# Patient Record
Sex: Female | Born: 1975 | Race: Black or African American | Hispanic: No | Marital: Married | Smoking: Never smoker
Health system: Southern US, Community
[De-identification: ages and names within clinical notes are randomized; demographics above are authoritative.]

---

## 2006-12-28 ENCOUNTER — Encounter (INDEPENDENT_AMBULATORY_CARE_PROVIDER_SITE_OTHER): Payer: Self-pay | Admitting: Obstetrics and Gynecology

## 2006-12-28 ENCOUNTER — Ambulatory Visit (HOSPITAL_COMMUNITY): Admission: AD | Admit: 2006-12-28 | Discharge: 2006-12-29 | Payer: Self-pay | Admitting: Obstetrics and Gynecology

## 2006-12-28 ENCOUNTER — Emergency Department (HOSPITAL_COMMUNITY): Admission: EM | Admit: 2006-12-28 | Discharge: 2006-12-28 | Payer: Self-pay | Admitting: Emergency Medicine

## 2007-01-12 ENCOUNTER — Ambulatory Visit: Payer: Self-pay | Admitting: Gynecology

## 2007-02-23 ENCOUNTER — Ambulatory Visit (HOSPITAL_COMMUNITY): Admission: RE | Admit: 2007-02-23 | Discharge: 2007-02-23 | Payer: Self-pay | Admitting: Obstetrics and Gynecology

## 2008-03-22 ENCOUNTER — Emergency Department: Payer: Self-pay | Admitting: Emergency Medicine

## 2008-10-28 ENCOUNTER — Emergency Department: Payer: Self-pay | Admitting: Emergency Medicine

## 2008-12-30 ENCOUNTER — Observation Stay: Payer: Self-pay | Admitting: Obstetrics & Gynecology

## 2009-04-29 ENCOUNTER — Ambulatory Visit: Payer: Self-pay | Admitting: Family Medicine

## 2009-12-13 IMAGING — US US OB < 14 WEEKS - US OB TV
1 series · 17 of 28 positions shown · non-contrast
Comparison: none

REASON FOR EXAM: spotting; unclear dates; hcg 3832
COMMENTS:

[Series 1: us ob < 14 weeks - us ob tv · 17 of 63 slices shown]
[im 1/63]
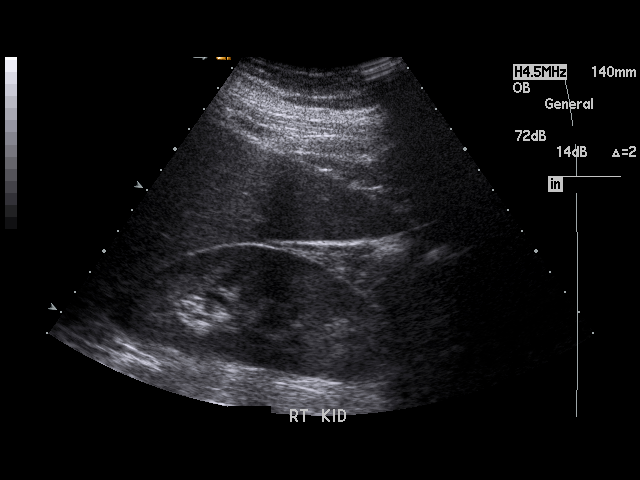
[im 5/63]
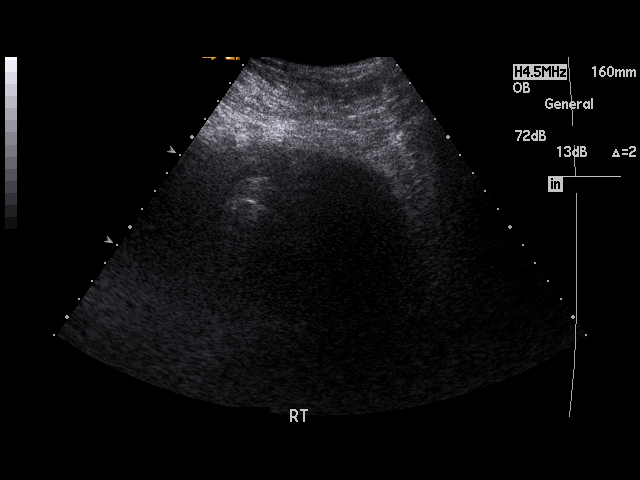
[im 10/63]
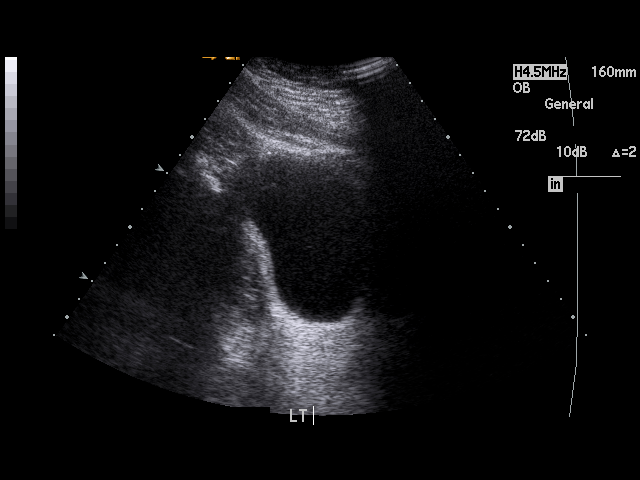
[im 12/63]
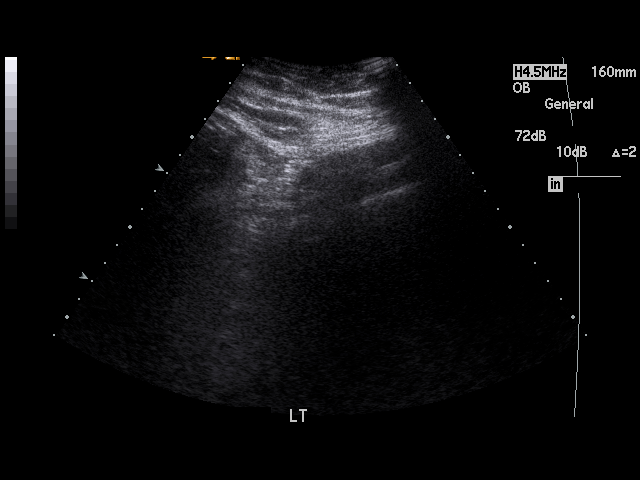
[im 17/63]
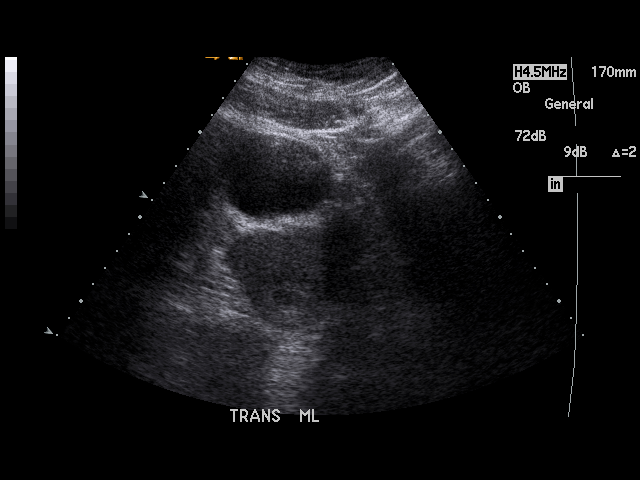
[im 21/63]
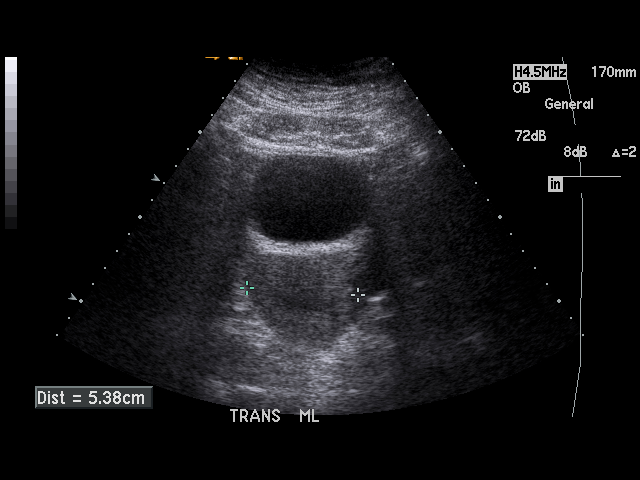
[im 23/63]
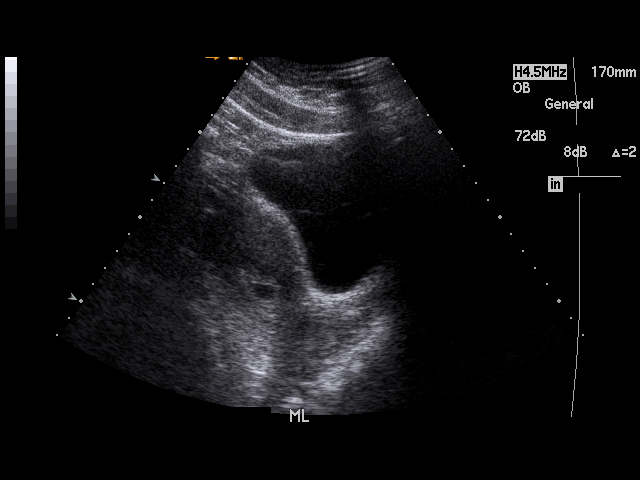
[im 28/63]
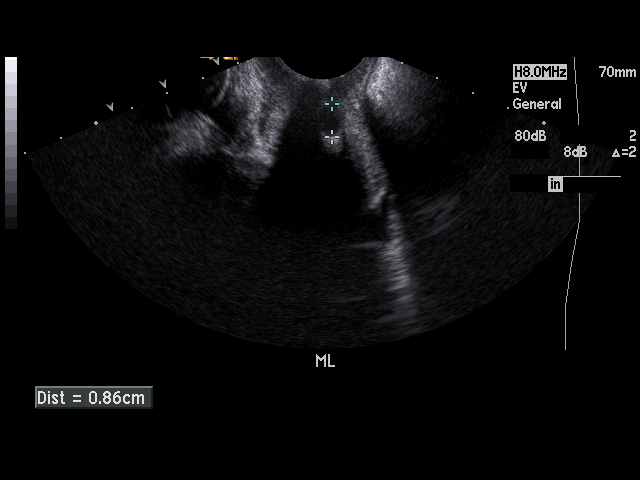
[im 33/63]
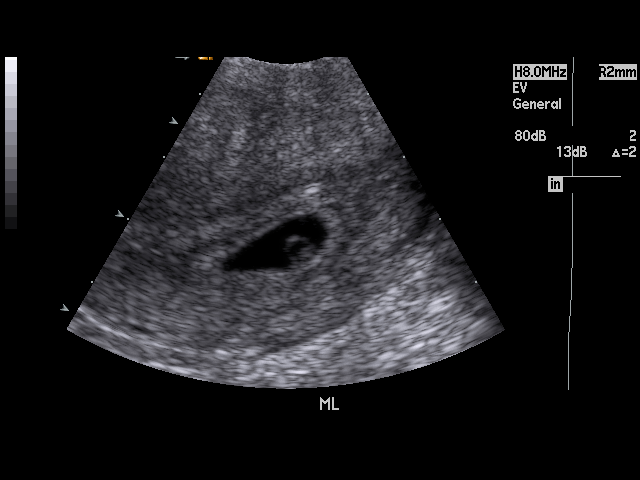
[im 35/63]
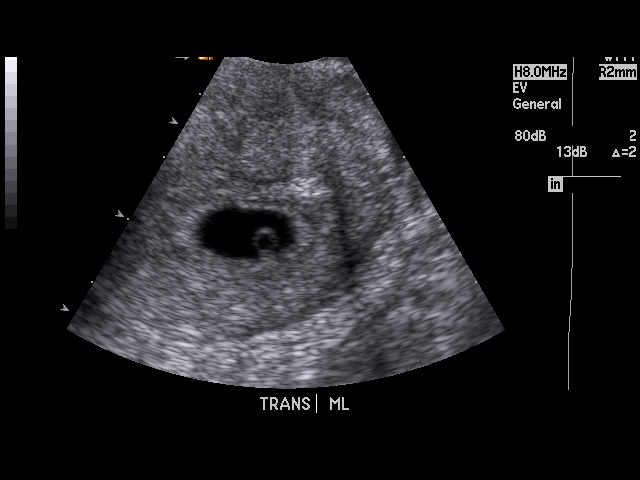
[im 40/63]
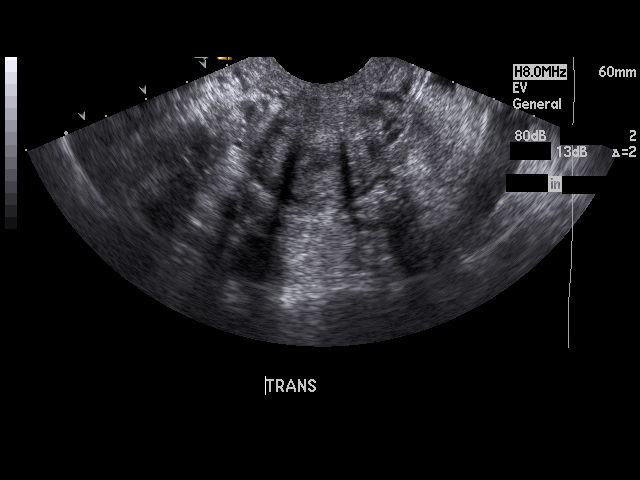
[im 42/63]
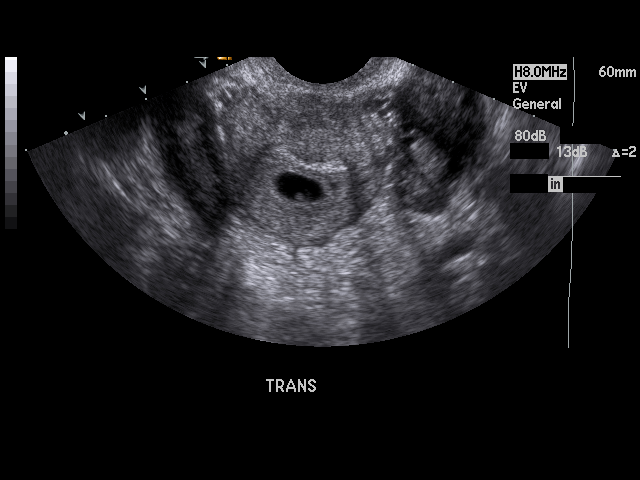
[im 46/63]
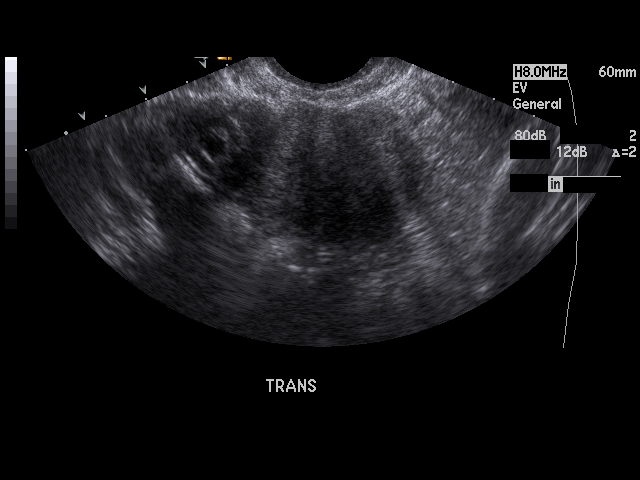
[im 51/63]
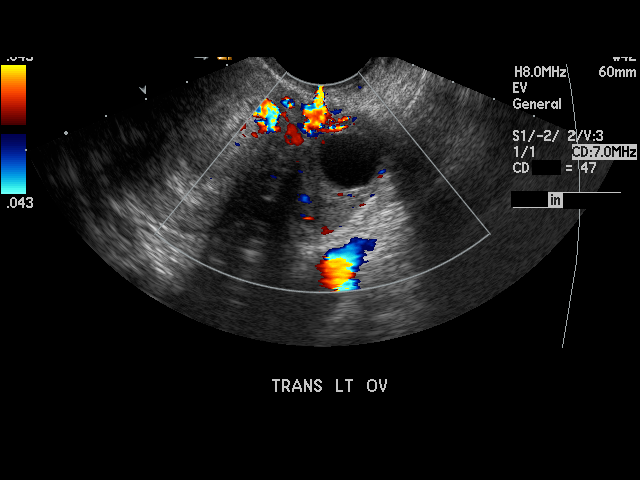
[im 53/63]
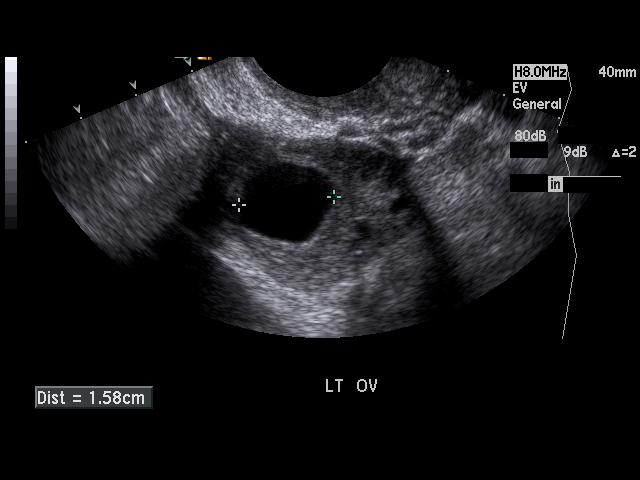
[im 58/63]
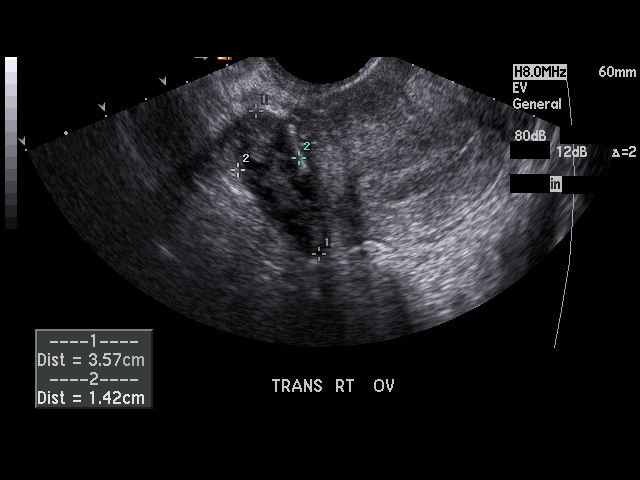
[im 63/63]
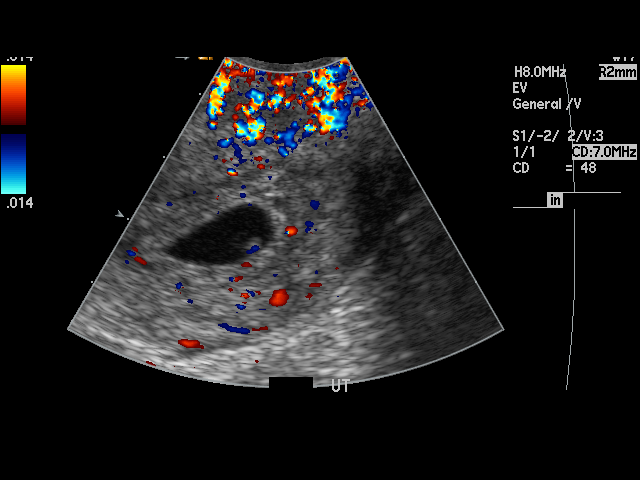

[17 of 28 positions shown; findings below may reference images not displayed]

PROCEDURE:     US  - US OB LESS THAN 14 WEEKS/W TRANS  - October 28, 2008  [DATE]

RESULT:     There is a viable IUP present. The crown-rump length measures
0.23 cm corresponding to a 5 week 5 day gestation. A yolk sac is
demonstrated. The cardiac rate is somewhat low at 95 beats per minute. There
is a tiny amount of free fluid in the cul-de-sac. The right ovary measures
3.4 x 3.6 x 1.4 cm. The left ovary measures 3.3 x 3.8 x 1.8 cm. There is an
ovarian cyst on the left measuring 1.6 cm in diameter. The patient has
history of prior surgery for a septate uterus. No duplication of the uterine
cavities is identified currently.
IMPRESSION: 1. There is an early IUP with an estimated gestational age of 5 weeks 5
days. The cardiac rate is somewhat lobe. The estimated date of confinement
is 25 June, 2009. Followup scanning is available as felt clinically
indicated.
2. There is a 1.6 cm diameter left ovarian cyst.

A preliminary report was called to the [HOSPITAL] the
conclusion of the study

## 2010-10-30 NOTE — Op Note (Signed)
NAMEANGELICE, PIECH           ACCOUNT NO.:  [id number]   MEDICAL RECORD NO.:  [id number]          PATIENT TYPE:  MAT   LOCATION:  [facility]                          FACILITY:  [facility]   PHYSICIAN:  Malachi Pro. Ambrose Mantle, M.D. DATE OF BIRTH:  08/24/1975   DATE OF PROCEDURE:  12/28/2006  DATE OF DISCHARGE:                               OPERATIVE REPORT   PREOPERATIVE DIAGNOSIS:  Incomplete abortion.   POSTOPERATIVE DIAGNOSIS:  Incomplete abortion.   OPERATION:  Suction D&C.   OPERATOR:  Dr. Ambrose Mantle   ANESTHESIA:  General anesthesia with mask.   DESCRIPTION OF PROCEDURE:  The patient was brought to the operating room  and placed under satisfactory general anesthesia. She was placed in  lithotomy position in [place] stirrups.  Exam revealed the uterus to be  approximately 9 weeks size anterior. The adnexa were free of masses.  A  speculum was inserted and 10 mL of 1% Xylocaine were divided between 12,  4 and 8 o'clock on the cervix.  The cervix was grasped with a tenaculum,  dilated to a 27 Pratt dilator.  The tissue that was in the cervical os  was removed. A considerable amount of tissue came with the removal. The  uterus was sounded to 11-12 cm, then I put the #8 curved suction curette  all the way to the fundus until I felt good resistance, did several  systematic circuits through the endometrial cavity with the #8 curved  suction curette and at one point, obtained a significant amount of  tissue.  Then stopped suction and compressed it into the 8 mm suction  opening until it went into the suction container. I did another circuit  with the suction, did a circuit with the sharp curette to ensure the  walls felt smooth and felt that the procedure was complete.  The patient  seemed to tolerate the procedure well.  Blood loss was felt to be no  more than 25 mL.  Sponge and needle counts were correct and the patient  was returned to recovery in satisfactory condition.      Malachi Pro. Ambrose Mantle,  M.D.  Electronically Signed     TFH/MEDQ  D:  12/29/2006  T:  12/29/2006  Job:  [id number]

## 2011-04-02 LAB — WET PREP, GENITAL
Clue Cells Wet Prep HPF POC: NONE SEEN
Trich, Wet Prep: NONE SEEN

## 2011-04-02 LAB — CBC
Hemoglobin: 11.6 — ABNORMAL LOW
Platelets: 518 — ABNORMAL HIGH
RDW: 14.3 — ABNORMAL HIGH

## 2011-04-02 LAB — I-STAT 8, (EC8 V) (CONVERTED LAB)
BUN: 8
Bicarbonate: 22.3
HCT: 39
Operator id: 235561
pCO2, Ven: 38 — ABNORMAL LOW
pH, Ven: 7.376 — ABNORMAL HIGH

## 2011-04-02 LAB — ABO/RH: ABO/RH(D): B POS

## 2011-04-02 LAB — GC/CHLAMYDIA PROBE AMP, GENITAL
Chlamydia, DNA Probe: NEGATIVE
GC Probe Amp, Genital: NEGATIVE

## 2011-04-02 LAB — HCG, QUANTITATIVE, PREGNANCY: hCG, Beta Chain, Quant, S: 27132 — ABNORMAL HIGH

## 2012-12-22 ENCOUNTER — Encounter: Payer: Self-pay | Admitting: Obstetrics & Gynecology

## 2012-12-22 DIAGNOSIS — Z01419 Encounter for gynecological examination (general) (routine) without abnormal findings: Secondary | ICD-10-CM

## 2020-09-20 ENCOUNTER — Other Ambulatory Visit: Payer: Self-pay

## 2020-09-20 ENCOUNTER — Ambulatory Visit
Admission: EM | Admit: 2020-09-20 | Discharge: 2020-09-20 | Disposition: A | Discharge status: Other Acute Inpatient Hospital | Payer: Self-pay | Attending: Family Medicine | Admitting: Family Medicine

## 2020-09-20 ENCOUNTER — Encounter: Payer: Self-pay | Admitting: Emergency Medicine

## 2020-09-20 DIAGNOSIS — R1031 Right lower quadrant pain: Secondary | ICD-10-CM | POA: Insufficient documentation

## 2020-09-20 DIAGNOSIS — R7989 Other specified abnormal findings of blood chemistry: Secondary | ICD-10-CM | POA: Insufficient documentation

## 2020-09-20 DIAGNOSIS — I160 Hypertensive urgency: Secondary | ICD-10-CM | POA: Insufficient documentation

## 2020-09-20 LAB — CBC WITH DIFFERENTIAL/PLATELET
Abs Immature Granulocytes: 0.03 10*3/uL (ref 0.00–0.07)
Basophils Absolute: 0.1 10*3/uL (ref 0.0–0.1)
Basophils Relative: 1 %
Eosinophils Absolute: 0.3 10*3/uL (ref 0.0–0.5)
Eosinophils Relative: 4 %
HCT: 35.6 % — ABNORMAL LOW (ref 36.0–46.0)
Hemoglobin: 12 g/dL (ref 12.0–15.0)
Immature Granulocytes: 0 %
Lymphocytes Relative: 22 %
Lymphs Abs: 1.9 10*3/uL (ref 0.7–4.0)
MCH: 28 pg (ref 26.0–34.0)
MCHC: 33.7 g/dL (ref 30.0–36.0)
MCV: 83.2 fL (ref 80.0–100.0)
Monocytes Absolute: 1.2 10*3/uL — ABNORMAL HIGH (ref 0.1–1.0)
Monocytes Relative: 13 %
Neutro Abs: 5.4 10*3/uL (ref 1.7–7.7)
Neutrophils Relative %: 60 %
Platelets: 534 10*3/uL — ABNORMAL HIGH (ref 150–400)
RBC: 4.28 MIL/uL (ref 3.87–5.11)
RDW: 15 % (ref 11.5–15.5)
WBC: 8.9 10*3/uL (ref 4.0–10.5)
nRBC: 0 % (ref 0.0–0.2)

## 2020-09-20 LAB — COMPREHENSIVE METABOLIC PANEL
ALT: 15 U/L (ref 0–44)
AST: 16 U/L (ref 15–41)
Albumin: 3.8 g/dL (ref 3.5–5.0)
Alkaline Phosphatase: 70 U/L (ref 38–126)
Anion gap: 6 (ref 5–15)
BUN: 14 mg/dL (ref 6–20)
CO2: 28 mmol/L (ref 22–32)
Calcium: 9 mg/dL (ref 8.9–10.3)
Chloride: 97 mmol/L — ABNORMAL LOW (ref 98–111)
Creatinine, Ser: 1.31 mg/dL — ABNORMAL HIGH (ref 0.44–1.00)
GFR, Estimated: 51 mL/min — ABNORMAL LOW (ref >60)
Glucose, Bld: 109 mg/dL — ABNORMAL HIGH (ref 70–99)
Potassium: 3.5 mmol/L (ref 3.5–5.1)
Sodium: 131 mmol/L — ABNORMAL LOW (ref 135–145)
Total Bilirubin: <0.1 mg/dL — ABNORMAL LOW (ref 0.3–1.2)
Total Protein: 8 g/dL (ref 6.5–8.1)

## 2020-09-20 LAB — URINALYSIS, COMPLETE (UACMP) WITH MICROSCOPIC
Glucose, UA: NEGATIVE mg/dL
Hgb urine dipstick: NEGATIVE
Leukocytes,Ua: NEGATIVE
Nitrite: NEGATIVE
Protein, ur: 30 mg/dL — AB
Specific Gravity, Urine: 1.025 (ref 1.005–1.030)
pH: 5.5 (ref 5.0–8.0)

## 2020-09-20 NOTE — ED Notes (Signed)
Patient is being discharged from the Urgent Care and sent to the Emergency Department via POV . Per Curly [place] NP, patient is in need of higher level of care due to current symptoms. Patient is aware and verbalizes understanding of plan of care.  Vitals:   09/20/20 1807 09/20/20 1935  BP: (!) 180/110 (!) 200/120  Pulse: 100   Resp: 18   Temp: 98.8 F (37.1 C)   SpO2: 99%

## 2020-09-20 NOTE — ED Provider Notes (Signed)
[facility]    CSN: [id number] Arrival date & time: 09/20/20  1746      History   Chief Complaint Chief Complaint  Patient presents with  . Abdominal Pain    HPI Valerie Kennedy is a 45 y.o. female presenting for lower abdominal pain x4 days patient says abdominal pain was diffuse and severe at onset and she had significant symptoms on the first day including nausea and vomiting.  She says the pain is now dull aching/gnawing pain of the right lower quadrant.  Denies any continued nausea or vomiting but admits that she has not had much of an appetite.  She says her last bowel movement was about 5 days ago.  Patient says that she is normally regular and goes every day or every 2 days.  Denies any fever, fatigue, body aches, dysuria, urinary frequency urgency, vaginal discharge/itching or odor.  Denies any similar problems in the past.  Has taken milk of magnesia and Benadryl with slight improvement in symptoms.  Patient says that she is feels better today than she did at onset, but she still does not feel like herself.  Blood pressure is elevated at 180/110.  Patient states that she has been told before that she has high blood pressure but does not have a PCP and does not take any medication for hypertension.  No other concerns.  HPI  History reviewed. No pertinent past medical history.  There are no problems to display for this patient.   History reviewed. No pertinent surgical history.  OB History   No obstetric history on file.      Home Medications    Prior to Admission medications   Not on File    Family History No family history on file.  Social History Social History   Tobacco Use  . Smoking status: Never Smoker  . Smokeless tobacco: Never Used  Vaping Use  . Vaping Use: Never used  Substance Use Topics  . Alcohol use: Yes  . Drug use: Not Currently    Types: Marijuana     Allergies   Patient has no known allergies.   Review of  Systems Review of Systems  Constitutional: Positive for appetite change. Negative for chills, fatigue and fever.  Respiratory: Negative for shortness of breath.   Cardiovascular: Negative for chest pain.  Gastrointestinal: Positive for abdominal pain, nausea and vomiting. Negative for constipation and diarrhea.  Genitourinary: Negative for decreased urine volume, dysuria, flank pain, frequency, hematuria, pelvic pain, urgency, vaginal bleeding, vaginal discharge and vaginal pain.  Musculoskeletal: Negative for back pain.  Skin: Negative for rash.  Neurological: Negative for dizziness, weakness and headaches.     Physical Exam Triage Vital Signs ED Triage Vitals  Enc Vitals Group     BP 09/20/20 1807 (!) 180/110     Pulse Rate 09/20/20 1807 100     Resp 09/20/20 1807 18     Temp 09/20/20 1807 98.8 F (37.1 C)     Temp Source 09/20/20 1807 Oral     SpO2 09/20/20 1807 99 %     Weight 09/20/20 1805 280 lb (127 kg)     Height 09/20/20 1805 5\' 4"  (1.626 m)     Head Circumference --      Peak Flow --      Pain Score 09/20/20 1805 5     Pain Loc --      Pain Edu? --      Excl. in GC? --  No data found.  Updated Vital Signs BP (!) 200/120 (BP Location: Right Arm)   Pulse 100   Temp 98.8 F (37.1 C) (Oral)   Resp 18   Ht 5\' 4"  (1.626 m)   Wt 280 lb (127 kg)   LMP 09/13/2020   SpO2 99%   BMI 48.06 kg/m       Physical Exam Vitals and nursing note reviewed.  Constitutional:      General: She is not in acute distress.    Appearance: Normal appearance. She is obese. She is not ill-appearing or toxic-appearing.  HENT:     Head: Normocephalic and atraumatic.     Nose: Nose normal.     Mouth/Throat:     Mouth: Mucous membranes are moist.     Pharynx: Oropharynx is clear.  Eyes:     General: No scleral icterus.       Right eye: No discharge.        Left eye: No discharge.     Conjunctiva/sclera: Conjunctivae normal.  Cardiovascular:     Rate and Rhythm: Normal rate  and regular rhythm.     Heart sounds: Normal heart sounds.  Pulmonary:     Effort: Pulmonary effort is normal. No respiratory distress.     Breath sounds: Normal breath sounds.  Abdominal:     General: Bowel sounds are normal.     Palpations: Abdomen is soft.     Tenderness: There is abdominal tenderness in the right lower quadrant. There is right CVA tenderness.  Musculoskeletal:     Cervical back: Neck supple.  Skin:    General: Skin is dry.  Neurological:     General: No focal deficit present.     Mental Status: She is alert. Mental status is at baseline.     Motor: No weakness.     Gait: Gait normal.  Psychiatric:        Mood and Affect: Mood normal.        Behavior: Behavior normal.        Thought Content: Thought content normal.      UC Treatments / Results  Labs (all labs ordered are listed, but only abnormal results are displayed) Labs Reviewed  URINALYSIS, COMPLETE (UACMP) WITH MICROSCOPIC - Abnormal; Notable for the following components:      Result Value   APPearance HAZY (*)    Bilirubin Urine SMALL (*)    Ketones, ur TRACE (*)    Protein, ur 30 (*)    Bacteria, UA MANY (*)    All other components within normal limits  COMPREHENSIVE METABOLIC PANEL - Abnormal; Notable for the following components:   Sodium 131 (*)    Chloride 97 (*)    Glucose, Bld 109 (*)    Creatinine, Ser 1.31 (*)    Total Bilirubin <0.1 (*)    GFR, Estimated 51 (*)    All other components within normal limits  CBC WITH DIFFERENTIAL/PLATELET - Abnormal; Notable for the following components:   HCT 35.6 (*)    Platelets 534 (*)    Monocytes Absolute 1.2 (*)    All other components within normal limits    EKG   Radiology No results found.  Procedures Procedures (including critical care time)  Medications Ordered in UC Medications - No data to display  Initial Impression / Assessment and Plan / UC Course  I have reviewed the triage vital signs and the nursing  notes.  Pertinent labs & imaging results that were available during my  care of the patient were reviewed by me and considered in my medical decision making (see chart for details).   45 year old female presenting for abdominal pain x4 days.  Pain was initially generalized and severe and associated with nausea and vomiting.  Pain today is in the right lower quadrant and she describes it as aching and gnawing.  Has associated decreased appetite.  Blood pressure is 180/110 in clinic.  The rest of vital signs are normal and stable.  Exam significant for tenderness to palpation of the right lower quadrant.  Questionable mild right CVA tenderness as well.  Urinalysis obtained today.  Also obtained CBC and CMP.  DDx: Constipation, gastroenteritis, diverticulitis, cholecystitis, appendicitis, abdominal wall strain, UTI  BP recheck is 200/120.  Review of labs shows creatinine of 1.31 and GFR of 51.  BUN is normal.  Sodium is a little decreased at 131.  Unable to find any previous labs of patient's to compare to but she denies any history of CKD.  Advised patient that since she is complaining of abdominal pain, especially right lower quadrant pain and has a significantly elevated blood pressure with decreased renal function I advise she follows up in the emergency department at this time for likely a CT scan of abdomen, blood pressure control and possibly fluids. She does not have a PCP so this is another reason for advising ED. Patient reluctantly agrees and stated she would go to [facility] ED in [place] at this time. Does not want EMS transport.   Final Clinical Impressions(s) / UC Diagnoses   Final diagnoses:  Hypertensive urgency  Right lower quadrant abdominal pain  Abnormal serum creatinine level     Discharge Instructions     Your blood pressure is significantly elevated.  A normal blood pressure is 120/80 and yours is 200/120.  This is especially concerning since you have abdominal pain  as well as elevated creatinine level (decreased kidney function)  You have been advised to follow up immediately in the emergency department for concerning signs.symptoms. If you declined EMS transport, please have a family member take you directly to the ED at this time. Do not delay. Based on concerns about condition, if you do not follow up in th e ED, you may risk poor outcomes including worsening of condition, delayed treatment and potentially life threatening issues. If you have declined to go to the ED at this time, you should call your PCP immediately to set up a follow up appointment.  Go to ED for red flag symptoms, including; fevers you cannot reduce with Tylenol/Motrin, severe headaches, vision changes, numbness/weakness in part of the body, lethargy, confusion, intractable vomiting, severe dehydration, chest pain, breathing difficulty, severe persistent abdominal or pelvic pain, signs of severe infection (increased redness, swelling of an area), feeling faint or passing out, dizziness, etc. You should especially go to the ED for sudden acute worsening of condition if you do not elect to go at this time.     ED Prescriptions    None     PDMP not reviewed this encounter.   Shirlee Latch, PA-C 09/20/20 1956

## 2020-09-20 NOTE — Discharge Instructions (Addendum)
Your blood pressure is significantly elevated.  A normal blood pressure is 120/80 and yours is 200/120.  This is especially concerning since you have abdominal pain as well as elevated creatinine level (decreased kidney function)  You have been advised to follow up immediately in the emergency department for concerning signs.symptoms. If you declined EMS transport, please have a family member take you directly to the ED at this time. Do not delay. Based on concerns about condition, if you do not follow up in th e ED, you may risk poor outcomes including worsening of condition, delayed treatment and potentially life threatening issues. If you have declined to go to the ED at this time, you should call your PCP immediately to set up a follow up appointment.  Go to ED for red flag symptoms, including; fevers you cannot reduce with Tylenol/Motrin, severe headaches, vision changes, numbness/weakness in part of the body, lethargy, confusion, intractable vomiting, severe dehydration, chest pain, breathing difficulty, severe persistent abdominal or pelvic pain, signs of severe infection (increased redness, swelling of an area), feeling faint or passing out, dizziness, etc. You should especially go to the ED for sudden acute worsening of condition if you do not elect to go at this time.

## 2020-09-20 NOTE — ED Triage Notes (Signed)
Patient c/o severe abdominal pain, vomiting on Sunday. Patient states she has continued to have abdominal pain in the RLQ. Denies vomiting, denies diarrhea.
